# Patient Record
Sex: Female | Born: 1970 | Race: Black or African American | Hispanic: No | Smoking: Never smoker
Health system: Southern US, Community
[De-identification: ages and names within clinical notes are randomized; demographics above are authoritative.]

## PROBLEM LIST (undated history)

## (undated) DIAGNOSIS — F1910 Other psychoactive substance abuse, uncomplicated: Secondary | ICD-10-CM

## (undated) DIAGNOSIS — F319 Bipolar disorder, unspecified: Secondary | ICD-10-CM

## (undated) DIAGNOSIS — F4310 Post-traumatic stress disorder, unspecified: Secondary | ICD-10-CM

## (undated) DIAGNOSIS — F209 Schizophrenia, unspecified: Secondary | ICD-10-CM

## (undated) DIAGNOSIS — B20 Human immunodeficiency virus [HIV] disease: Secondary | ICD-10-CM

---

## 2001-03-20 ENCOUNTER — Inpatient Hospital Stay (HOSPITAL_COMMUNITY): Admission: AD | Admit: 2001-03-20 | Discharge: 2001-03-20 | Payer: Self-pay | Admitting: *Deleted

## 2003-02-06 ENCOUNTER — Emergency Department (HOSPITAL_COMMUNITY): Admission: EM | Admit: 2003-02-06 | Discharge: 2003-02-06 | Payer: Self-pay | Admitting: Emergency Medicine

## 2005-04-26 ENCOUNTER — Emergency Department (HOSPITAL_COMMUNITY): Admission: EM | Admit: 2005-04-26 | Discharge: 2005-04-26 | Payer: Self-pay | Admitting: Emergency Medicine

## 2005-05-16 ENCOUNTER — Emergency Department (HOSPITAL_COMMUNITY): Admission: EM | Admit: 2005-05-16 | Discharge: 2005-05-16 | Payer: Self-pay | Admitting: Emergency Medicine

## 2005-06-16 ENCOUNTER — Emergency Department (HOSPITAL_COMMUNITY): Admission: EM | Admit: 2005-06-16 | Discharge: 2005-06-16 | Payer: Self-pay | Admitting: Diagnostic Radiology

## 2005-08-28 ENCOUNTER — Emergency Department (HOSPITAL_COMMUNITY): Admission: EM | Admit: 2005-08-28 | Discharge: 2005-08-28 | Payer: Self-pay | Admitting: *Deleted

## 2012-01-12 ENCOUNTER — Emergency Department (HOSPITAL_BASED_OUTPATIENT_CLINIC_OR_DEPARTMENT_OTHER)
Admission: EM | Admit: 2012-01-12 | Discharge: 2012-01-12 | Disposition: A | Discharge status: Home / Self Care | Payer: Self-pay | Attending: Emergency Medicine | Admitting: Emergency Medicine

## 2012-01-12 ENCOUNTER — Encounter (HOSPITAL_BASED_OUTPATIENT_CLINIC_OR_DEPARTMENT_OTHER): Payer: Self-pay

## 2012-01-12 DIAGNOSIS — A599 Trichomoniasis, unspecified: Secondary | ICD-10-CM

## 2012-01-12 DIAGNOSIS — F4310 Post-traumatic stress disorder, unspecified: Secondary | ICD-10-CM | POA: Insufficient documentation

## 2012-01-12 DIAGNOSIS — Z79899 Other long term (current) drug therapy: Secondary | ICD-10-CM | POA: Insufficient documentation

## 2012-01-12 DIAGNOSIS — F319 Bipolar disorder, unspecified: Secondary | ICD-10-CM | POA: Insufficient documentation

## 2012-01-12 DIAGNOSIS — Z3202 Encounter for pregnancy test, result negative: Secondary | ICD-10-CM | POA: Insufficient documentation

## 2012-01-12 DIAGNOSIS — N760 Acute vaginitis: Secondary | ICD-10-CM | POA: Insufficient documentation

## 2012-01-12 DIAGNOSIS — A5901 Trichomonal vulvovaginitis: Secondary | ICD-10-CM | POA: Insufficient documentation

## 2012-01-12 DIAGNOSIS — B9689 Other specified bacterial agents as the cause of diseases classified elsewhere: Secondary | ICD-10-CM

## 2012-01-12 DIAGNOSIS — F209 Schizophrenia, unspecified: Secondary | ICD-10-CM | POA: Insufficient documentation

## 2012-01-12 DIAGNOSIS — F1910 Other psychoactive substance abuse, uncomplicated: Secondary | ICD-10-CM | POA: Insufficient documentation

## 2012-01-12 HISTORY — DX: Bipolar disorder, unspecified: F31.9

## 2012-01-12 HISTORY — DX: Post-traumatic stress disorder, unspecified: F43.10

## 2012-01-12 HISTORY — DX: Schizophrenia, unspecified: F20.9

## 2012-01-12 HISTORY — DX: Other psychoactive substance abuse, uncomplicated: F19.10

## 2012-01-12 LAB — URINALYSIS, ROUTINE W REFLEX MICROSCOPIC
Ketones, ur: NEGATIVE mg/dL
Nitrite: NEGATIVE
pH: 5.5 (ref 5.0–8.0)

## 2012-01-12 LAB — URINE MICROSCOPIC-ADD ON

## 2012-01-12 LAB — WET PREP, GENITAL

## 2012-01-12 MED ORDER — METRONIDAZOLE 500 MG PO TABS: 500.0000 mg | ORAL_TABLET | Freq: Two times a day (BID) | ORAL | Status: DC

## 2012-01-12 MED ORDER — NITROFURANTOIN MONOHYD MACRO 100 MG PO CAPS: 100.0000 mg | ORAL_CAPSULE | Freq: Two times a day (BID) | ORAL | Status: AC

## 2012-01-12 NOTE — ED Notes (Signed)
Pt reports vaginal d/c x 3 days.

## 2012-01-12 NOTE — ED Provider Notes (Signed)
History     CSN: [id number]  Arrival date & time 01/12/12  1401   First MD Initiated Contact with Patient 01/12/12 1429      Chief Complaint  Patient presents with  . Vaginal Discharge    (Consider location/radiation/quality/duration/timing/severity/associated sxs/prior treatment) Patient is a 41 y.o. female presenting with vaginal discharge. The history is provided by the patient. No language interpreter was used.  Vaginal Discharge This is a new problem. Episode onset: 3 days. The problem occurs constantly. The problem has been gradually worsening. Pertinent negatives include no abdominal pain. Nothing aggravates the symptoms. She has tried nothing for the symptoms. The treatment provided moderate relief.  Pt complains of a vaginal discharge.   Pt reports she noticed about 3 days ago  Past Medical History  Diagnosis Date  . Schizophrenia   . Bipolar 1 disorder   . PTSD (post-traumatic stress disorder)   . Substance abuse     History reviewed. No pertinent past surgical history.  History reviewed. No pertinent family history.  History  Substance Use Topics  . Smoking status: Never Smoker   . Smokeless tobacco: Not on file  . Alcohol Use: Yes     Comment: weekly    OB History    Grav Para Term Preterm Abortions TAB SAB Ect Mult Living                  Review of Systems  Gastrointestinal: Negative for abdominal pain.  Genitourinary: Positive for vaginal discharge.  All other systems reviewed and are negative.    Allergies  Review of patient's allergies indicates no known allergies.  Home Medications   Current Outpatient Rx  Name  Route  Sig  Dispense  Refill  . PALIPERIDONE ER 9 MG PO TB24   Oral   Take 9 mg by mouth every morning.         . TRAZODONE HCL 100 MG PO TABS   Oral   Take 100 mg by mouth at bedtime.         . VENLAFAXINE HCL ER 150 MG PO CP24   Oral   Take 150 mg by mouth daily.           BP 108/59  Pulse 73  Temp 97.8 F  (36.6 C) (Oral)  Resp 16  Ht 5\' 3"  (1.6 m)  Wt 120 lb (54.432 kg)  BMI 21.26 kg/m2  SpO2 99%  LMP 08/26/2011  Physical Exam  Nursing note and vitals reviewed. Constitutional: She is oriented to person, place, and time. She appears well-developed and well-nourished.  HENT:  Head: Normocephalic and atraumatic.  Right Ear: External ear normal.  Left Ear: External ear normal.  Nose: Nose normal.  Mouth/Throat: Oropharynx is clear and moist.  Eyes: EOM are normal. Pupils are equal, round, and reactive to light.  Neck: Normal range of motion.  Cardiovascular: Normal rate and normal heart sounds.   Pulmonary/Chest: Effort normal and breath sounds normal.  Abdominal: Soft.  Genitourinary: Uterus normal. Vaginal discharge found.       Thick white discharge  Musculoskeletal: Normal range of motion.  Neurological: She is alert and oriented to person, place, and time. She has normal reflexes.  Skin: Skin is warm.  Psychiatric: She has a normal mood and affect.    ED Course  Procedures (including critical care time)  Labs Reviewed  URINALYSIS, ROUTINE W REFLEX MICROSCOPIC - Abnormal; Notable for the following:    Leukocytes, UA SMALL (*)     All  other components within normal limits  URINE MICROSCOPIC-ADD ON - Abnormal; Notable for the following:    Squamous Epithelial / LPF FEW (*)     Bacteria, UA FEW (*)     All other components within normal limits  PREGNANCY, URINE  URINE CULTURE   No results found.   1. BV (bacterial vaginosis)   2. Trichomonas       MDM  Rx for flagyl and macrobid.        Lonia Skinner [place], [place] 01/12/12 1548  Lonia Skinner [place], [place] 01/12/12 1551  Lonia Skinner [place], [place] 01/12/12 1552  Lonia Skinner [place], [place] 01/12/12 [phone]

## 2012-01-13 LAB — URINE CULTURE

## 2012-01-13 NOTE — ED Provider Notes (Signed)
Medical screening examination/treatment/procedure(s) were performed by non-physician practitioner and as supervising physician I was immediately available for consultation/collaboration.   Loren Racer, MD 01/13/12 [phone]

## 2018-05-02 ENCOUNTER — Emergency Department (HOSPITAL_COMMUNITY)
Admission: EM | Admit: 2018-05-02 | Discharge: 2018-05-02 | Disposition: A | Discharge status: Home / Self Care | Payer: Self-pay | Attending: Emergency Medicine | Admitting: Emergency Medicine

## 2018-05-02 ENCOUNTER — Other Ambulatory Visit: Payer: Self-pay

## 2018-05-02 DIAGNOSIS — F209 Schizophrenia, unspecified: Secondary | ICD-10-CM | POA: Insufficient documentation

## 2018-05-02 DIAGNOSIS — F1910 Other psychoactive substance abuse, uncomplicated: Secondary | ICD-10-CM | POA: Insufficient documentation

## 2018-05-02 DIAGNOSIS — Z79899 Other long term (current) drug therapy: Secondary | ICD-10-CM | POA: Insufficient documentation

## 2018-05-02 NOTE — Discharge Instructions (Signed)
Use the resources provided for outpatient assistance with substance abuse.  Please return to the ER if you begin having feelings of harming yourself or ending your life, or thoughts of harming others.

## 2018-05-02 NOTE — ED Provider Notes (Signed)
[name]   CSN: [id number] Arrival date & time: 05/02/18  9470    History   Chief Complaint Chief Complaint  Patient presents with  . Addiction Problem    HPI Monica Warner is a 48 y.o. female with past medical history of bipolar 1 disorder, schizophrenia, substance abuse, presenting to the emergency department requesting assistance with placement at [facility] for rehabilitation.  [facility].  She had lab work done, including CBC and BMP which were unremarkable.  She was discharged with outpatient resources.  She presents to this ED now stating that the walk-in clinic at [facility] was not accepting new patients and she was instructed she needed to go through the hospital and case management in order to be accepted at [facility].  She endorses crack cocaine use and alcohol use.  She states she was clean for 3 years, however relapsed in November.  Last crack cocaine use is around 2 AM this morning, last alcohol use was 7 AM today.  She states she normally drinks about three 40 ounce bottles of beer daily.  She has never had withdrawal seizures.  She endorses feelings of anxiety and worry, stating she needs to get better.  She has not taken her psychiatric medication in 2 weeks.  She endorses auditory and visual hallucinations, however that has not been her concern recently.  It is not worsening from her baseline.  She denies suicidal or homicidal ideations.  Denies feelings of paranoia.  No medical complaints today.     The history is provided by the patient and medical records.    Past Medical History:  Diagnosis Date  . Bipolar 1 disorder   . PTSD (post-traumatic stress disorder)   . Schizophrenia   . Substance abuse     There are no active problems to display for this patient.   No past surgical history on file.   OB History   No obstetric history on file.      Home  Medications    Prior to Admission medications   Medication Sig Start Date End Date Taking? Authorizing Provider  atorvastatin (LIPITOR) 20 MG tablet Take 20 mg by mouth every evening.  04/04/18  Yes [provider]  BIKTARVY 50-200-25 MG TABS tablet Take 1 tablet by mouth daily. 04/04/18  Yes [provider]  valACYclovir (VALTREX) 1000 MG tablet Take 1,000 mg by mouth daily. 04/05/18  Yes [provider]  metroNIDAZOLE (FLAGYL) 500 MG tablet Take 1 tablet (500 mg total) by mouth 2 (two) times daily. Patient not taking: Reported on 05/02/2018 01/12/12   Osie Cheeks    Family History No family history on file.  Social History Social History   Tobacco Use  . Smoking status: Never Smoker  Substance Use Topics  . Alcohol use: Yes    Comment: weekly  . Drug use: Yes    Types: Cocaine    Comment: last used Saturday- is presently in [facility]     Allergies   Penicillins   Review of Systems Review of Systems  Neurological: Negative for seizures.  Psychiatric/Behavioral: Positive for hallucinations. Negative for self-injury and suicidal ideas. The patient is nervous/anxious.   All other systems reviewed and are negative.    Physical Exam Updated Vital Signs BP 118/78   Pulse 90   Temp 98 F (36.7 C) (Oral)   Resp 20   Ht 5\' 3"  (1.6 m)  Wt 77.1 kg   SpO2 99%   BMI 30.11 kg/m   Physical Exam Vitals signs and nursing note reviewed.  Constitutional:      Appearance: She is well-developed.  HENT:     Head: Normocephalic and atraumatic.  Eyes:     Conjunctiva/sclera: Conjunctivae normal.  Pulmonary:     Effort: Pulmonary effort is normal.  Abdominal:     Palpations: Abdomen is soft.  Skin:    General: Skin is warm.  Neurological:     Mental Status: She is alert.  Psychiatric:        Attention and Perception: Attention normal.        Mood and Affect: Mood is anxious.        Speech: Speech is rapid and pressured.        Behavior:  Behavior is cooperative.        Thought Content: Thought content is not paranoid. Thought content does not include homicidal or suicidal ideation.     Comments: Pt appears anxious, however is able to be verbally de-escalated.  Patient does not appear to be responding to internal stimuli.      ED Treatments / Results  Labs (all labs ordered are listed, but only abnormal results are displayed) Labs Reviewed - No data to display  EKG None  Radiology No results found.  Procedures Procedures (including critical care time)  Medications Ordered in ED Medications - No data to display   Initial Impression / Assessment and Plan / ED Course  I have reviewed the triage vital signs and the nursing notes.  Pertinent labs & imaging results that were available during my care of the patient were reviewed by me and considered in my medical decision making (see chart for details).        Patient presenting requesting assistance with rehab placement for alcohol and crack cocaine use.  Patient reports presenting to the walk-in clinic at [facility], however was declined in the setting of the current COVID-19 pandemic.  [facility] just [facility] to arrival to this ED with same request.  During that visit, she had labs obtained which were unremarkable.  She has history of schizophrenia and endorses auditory and visual hallucinations today, however not worsening from her baseline.  She denies suicidal or homicidal ideations.  She has not expressed feelings of paranoia or delusions.  Patient appears anxious on exam, however does not appear to be a threat to herself or others.  Last alcohol use was 7 AM, last crack cocaine use was 2 AM this morning.  Unlikely in withdrawal.  Patient provided with outpatient resources by peer support and social work.  In the setting of the COVID-19 pandemic, local rehabilitation clinics for substance abuse or not accepting new inpatients at this time,  however outpatient resources are still available.  Patient is aware of this, and discharged with the appropriate resources.  Patient was discussed with Dr. Anitra LauthPlunkett.  Discussed results, findings, treatment and follow up. Patient advised of return precautions. Patient verbalized understanding and agreed with plan.   Final Clinical Impressions(s) / ED Diagnoses   Final diagnoses:  Polysubstance abuse [facility](HCC)    ED Discharge Orders    None       Robinson, [place] N, PA-C 05/02/18 1437    Gwyneth SproutPlunkett, [place], MD 05/03/18 1950

## 2018-05-02 NOTE — ED Triage Notes (Signed)
Pt looking for assistance with rehab placement.  Wants to stop smoking cracking, drinking ETOH.  Pt reports last drug use appx 0200 today and last drink 0700 today.  Pt cooperative, alert.

## 2018-05-02 NOTE — Patient Outreach (Signed)
ED Peer Support Specialist Patient Intake (Complete at intake & 30-60 Day Follow-up)  Name: Monica Warner  MRN: [id number]  Age: 48 y.o.   Date of Admission: 05/02/2018  Intake: Initial Comments:      Primary Reason Admitted: alcohol and crack cocaine use  Lab values: Alcohol/ETOH: Not completed Positive UDS? 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 No  Number of ED visits:    Reason(s) for visit:    In past year, has patient been hospitalized for any reason? No  Number of hospitalizations:    Reason(s) for hospitalization:    In past year, has patient been arrested? No  Number of arrests:    Reason(s) for arrest:    In past year, has patient been incarcerated? No  Number of incarcerations:    Reason(s) for incarceration:    In past year, has patient received medication-assisted treatment? No  In past year, patient received the following treatments:    In past year, has patient received any harm reduction services? No  Did this include any of the following?    In past year, has patient received care from a mental health provider for diagnosis other than SUD? No  In past year, is this first  time patient has overdosed? (has not overdosed)  Number of past overdoses:    In past year, is this first time patient has been hospitalized for an overdose? (has not overdosed)  Number of hospitalizations for overdose(s):    Is patient currently receiving treatment for a mental health diagnosis? No  Patient reports experiencing difficulty participating in SUD treatment: No    Most important reason(s) for this difficulty?    Has patient received prior services for treatment? No  In past, patient has received services from following agencies:    Plan of Care:  Suggested follow up at these agencies/treatment centers: [facility] ([facility])([facility] was interested in detox/long-term substance use treatment resources.  [facility]PSS will help with those resources. )  Other information: [facility] met with the patient to provide substance use recovery support and help with substance use treatment resources. Currently, [facility] and [facility] do not have female detox/residential substance use treatment beds available. Patient was interested in following up with [facility] in [place] where they currently have female detox beds, but have a wait list for the residential treatment beds. CPSS provided follow-up information [place] program as well as a detox center list, residential/outpatient substance use treatment center list, [facility], and homeless shelter list with information for the women's emergency shelter at the [place].    Mason Jim, CPSS  05/02/2018 1:29 PM

## 2018-05-02 NOTE — ED Notes (Signed)
Peer Support at Bedside

## 2018-05-02 NOTE — ED Notes (Signed)
Reviewed D/C paperwork with pt. Pt verbalizes understanding. Pt was ambulatory upon discharge.

## 2019-04-21 ENCOUNTER — Encounter (HOSPITAL_BASED_OUTPATIENT_CLINIC_OR_DEPARTMENT_OTHER): Payer: Self-pay | Admitting: Emergency Medicine

## 2019-04-21 ENCOUNTER — Other Ambulatory Visit: Payer: Self-pay

## 2019-04-21 ENCOUNTER — Emergency Department (HOSPITAL_BASED_OUTPATIENT_CLINIC_OR_DEPARTMENT_OTHER)
Admission: EM | Admit: 2019-04-21 | Discharge: 2019-04-21 | Disposition: A | Discharge status: Home / Self Care | Payer: BC Managed Care – PPO | Attending: Emergency Medicine | Admitting: Emergency Medicine

## 2019-04-21 DIAGNOSIS — A599 Trichomoniasis, unspecified: Secondary | ICD-10-CM

## 2019-04-21 DIAGNOSIS — A5901 Trichomonal vulvovaginitis: Secondary | ICD-10-CM | POA: Insufficient documentation

## 2019-04-21 DIAGNOSIS — N760 Acute vaginitis: Secondary | ICD-10-CM | POA: Insufficient documentation

## 2019-04-21 DIAGNOSIS — B20 Human immunodeficiency virus [HIV] disease: Secondary | ICD-10-CM | POA: Diagnosis not present

## 2019-04-21 DIAGNOSIS — Z79899 Other long term (current) drug therapy: Secondary | ICD-10-CM | POA: Diagnosis not present

## 2019-04-21 DIAGNOSIS — B9689 Other specified bacterial agents as the cause of diseases classified elsewhere: Secondary | ICD-10-CM

## 2019-04-21 DIAGNOSIS — Z880 Allergy status to penicillin: Secondary | ICD-10-CM | POA: Diagnosis not present

## 2019-04-21 DIAGNOSIS — N899 Noninflammatory disorder of vagina, unspecified: Secondary | ICD-10-CM | POA: Diagnosis present

## 2019-04-21 HISTORY — DX: Human immunodeficiency virus (HIV) disease: B20

## 2019-04-21 LAB — URINALYSIS, ROUTINE W REFLEX MICROSCOPIC
Bilirubin Urine: NEGATIVE
Glucose, UA: NEGATIVE mg/dL
Ketones, ur: NEGATIVE mg/dL
Nitrite: NEGATIVE
Protein, ur: NEGATIVE mg/dL
Specific Gravity, Urine: 1.02 (ref 1.005–1.030)
pH: 6 (ref 5.0–8.0)

## 2019-04-21 LAB — URINALYSIS, MICROSCOPIC (REFLEX)

## 2019-04-21 LAB — WET PREP, GENITAL
Trich, Wet Prep: NONE SEEN
Yeast Wet Prep HPF POC: NONE SEEN

## 2019-04-21 LAB — PREGNANCY, URINE: Preg Test, Ur: NEGATIVE

## 2019-04-21 MED ORDER — LIDOCAINE HCL (PF) 1 % IJ SOLN
INTRAMUSCULAR | Status: AC
  Administered 2019-04-21: 1 mL
  Filled 2019-04-21: qty 5

## 2019-04-21 MED ORDER — CEFTRIAXONE SODIUM 500 MG IJ SOLR
500.0000 mg | Freq: Once | INTRAMUSCULAR | Status: AC
  Administered 2019-04-21: 500 mg via INTRAMUSCULAR
  Filled 2019-04-21: qty 500

## 2019-04-21 MED ORDER — METRONIDAZOLE 500 MG PO TABS: 500.0000 mg | ORAL_TABLET | Freq: Two times a day (BID) | ORAL | 0 refills | Status: AC

## 2019-04-21 MED ORDER — DOXYCYCLINE HYCLATE 100 MG PO CAPS: 100.0000 mg | ORAL_CAPSULE | Freq: Two times a day (BID) | ORAL | 0 refills | Status: AC

## 2019-04-21 NOTE — ED Provider Notes (Signed)
[facility] EMERGENCY DEPARTMENT Provider Note   CSN: [id number] Arrival date & time: 04/21/19  0820     History Chief Complaint  Patient presents with  . Vaginal Discharge  . Vaginal Itching    Monica Warner is a 49 y.o. female.  49yo F w/ PMH including bipolar d/o, HIV who p/w vaginal discharge. Pt notes that at the beginning of the month, she had a menstrual period which she hasn't had in many years. After that, she began having vaginal discharge and itching that has worsened despite trying OTC yeast infection medication. Recently she has been having some lower abd discomfort/soreness. No fevers, vomiting, or recent illness. Mild dysuria this morning. She notes that she recently got back together with her ex-husband and she is concerned he may have given her an STD.   The history is provided by the patient.  Vaginal Discharge Associated symptoms: vaginal itching   Vaginal Itching       Past Medical History:  Diagnosis Date  . Bipolar 1 disorder ([place])   . HIV (human immunodeficiency virus infection) ([place])   . PTSD (post-traumatic stress disorder)   . Schizophrenia ([place])   . Substance abuse ([place])     There are no problems to display for this patient.   History reviewed. No pertinent surgical history.   OB History   No obstetric history on file.     History reviewed. No pertinent family history.  Social History   Tobacco Use  . Smoking status: Never Smoker  Substance Use Topics  . Alcohol use: Yes    Comment: weekly  . Drug use: Yes    Types: Cocaine    Comment: last used Saturday- is presently in [facility] Medications Prior to Admission medications   Medication Sig Start Date End Date Taking? Authorizing Provider  atorvastatin (LIPITOR) 20 MG tablet Take 20 mg by mouth every evening.  04/04/18   [provider]  BIKTARVY 50-200-25 MG TABS tablet Take 1 tablet by mouth daily. 04/04/18   [provider]  doxycycline  (VIBRAMYCIN) 100 MG capsule Take 1 capsule (100 mg total) by mouth 2 (two) times daily. 04/21/19   Monica Warner, Monica Overland, MD  metroNIDAZOLE (FLAGYL) 500 MG tablet Take 1 tablet (500 mg total) by mouth 2 (two) times daily. 04/21/19   Monica Warner, Monica Overland, MD  valACYclovir (VALTREX) 1000 MG tablet Take 1,000 mg by mouth daily. 04/05/18   [provider]    Allergies    Penicillins  Review of Systems   Review of Systems  Genitourinary: Positive for vaginal discharge.   All other systems reviewed and are negative except that which was mentioned in HPI  Physical Exam Updated Vital Signs BP 135/89   Pulse 63   Temp 98.3 F (36.8 C)   Resp 18   SpO2 100%   Physical Exam Vitals and nursing note reviewed. Exam conducted with a chaperone present.  Constitutional:      General: She is not in acute distress.    Appearance: She is well-developed.  HENT:     Head: Normocephalic and atraumatic.     Nose: Nose normal.  Eyes:     Conjunctiva/sclera: Conjunctivae normal.  Abdominal:     General: Abdomen is flat.     Palpations: Abdomen is soft.     Tenderness: There is abdominal tenderness (suprapubic).  Genitourinary:    Comments: Trace white discharge in vaginal vault, no cervical lesions, +CMT and uterine tenderness on bimanual exam  Musculoskeletal:     Cervical back: Neck supple.  Skin:    General: Skin is warm and dry.  Neurological:     Mental Status: She is alert and oriented to person, place, and time.  Psychiatric:        Judgment: Judgment normal.     ED Results / Procedures / Treatments   Labs (all labs ordered are listed, but only abnormal results are displayed) Labs Reviewed  WET PREP, GENITAL - Abnormal; Notable for the following components:      Result Value   Clue Cells Wet Prep HPF POC PRESENT (*)    WBC, Wet Prep HPF POC MANY (*)    All other components within normal limits  URINALYSIS, ROUTINE W REFLEX MICROSCOPIC - Abnormal; Notable for the  following components:   Hgb urine dipstick TRACE (*)    Leukocytes,Ua SMALL (*)    All other components within normal limits  URINALYSIS, MICROSCOPIC (REFLEX) - Abnormal; Notable for the following components:   Bacteria, UA FEW (*)    Trichomonas, UA PRESENT (*)    All other components within normal limits  URINE CULTURE  PREGNANCY, URINE  RPR  GC/CHLAMYDIA PROBE AMP ([place]) NOT AT [facility]    EKG None  Radiology No results found.  Procedures Procedures (including critical care time)  Medications Ordered in ED Medications  cefTRIAXone (ROCEPHIN) injection 500 mg (500 mg Intramuscular Given 04/21/19 0919)  lidocaine (PF) (XYLOCAINE) 1 % injection (1 mL  Given 04/21/19 0919)    ED Course  I have reviewed the triage vital signs and the nursing notes.  Pertinent labs that were available during my care of the patient were reviewed by me and considered in my medical decision making (see chart for details).    MDM Rules/Calculators/A&P                      VS reassuring. No focal unilateral pain to suggest TOA. UA w/ trichomonas, wet prep w/ clue cells. Based on exam, will treat for early PID with CTX and doxycycline as well as flagyl for BV. Counseled on safe sex practices and having partners tested and treated prior to future intercourse. Counseled on side effects of antibiotics. Return precautions reviewed. Final Clinical Impression(s) / ED Diagnoses Final diagnoses:  BV (bacterial vaginosis)  Trichomoniasis    Rx / DC Orders ED Discharge Orders         Ordered    doxycycline (VIBRAMYCIN) 100 MG capsule  2 times daily     04/21/19 1000    metroNIDAZOLE (FLAGYL) 500 MG tablet  2 times daily     04/21/19 1000           Monica Warner, Monica Finland, MD 04/21/19 1002

## 2019-04-21 NOTE — ED Triage Notes (Signed)
Pt believes she was exposed to STD by her husband since it has occurred before. Foul smelling vaginal discharge, itching and swelling present.

## 2019-04-22 LAB — URINE CULTURE: Culture: NO GROWTH

## 2019-04-22 LAB — RPR: RPR Ser Ql: NONREACTIVE

## 2019-04-24 LAB — GC/CHLAMYDIA PROBE AMP (~~LOC~~) NOT AT ARMC
Chlamydia: NEGATIVE
Neisseria Gonorrhea: NEGATIVE

## 2019-08-25 ENCOUNTER — Emergency Department (HOSPITAL_BASED_OUTPATIENT_CLINIC_OR_DEPARTMENT_OTHER)
Admission: EM | Admit: 2019-08-25 | Discharge: 2019-08-25 | Disposition: A | Discharge status: Home / Self Care | Payer: BC Managed Care – PPO | Attending: Emergency Medicine | Admitting: Emergency Medicine

## 2019-08-25 ENCOUNTER — Encounter (HOSPITAL_BASED_OUTPATIENT_CLINIC_OR_DEPARTMENT_OTHER): Payer: Self-pay | Admitting: Emergency Medicine

## 2019-08-25 ENCOUNTER — Other Ambulatory Visit: Payer: Self-pay

## 2019-08-25 DIAGNOSIS — M25562 Pain in left knee: Secondary | ICD-10-CM | POA: Diagnosis not present

## 2019-08-25 DIAGNOSIS — F1420 Cocaine dependence, uncomplicated: Secondary | ICD-10-CM | POA: Insufficient documentation

## 2019-08-25 DIAGNOSIS — K0889 Other specified disorders of teeth and supporting structures: Secondary | ICD-10-CM

## 2019-08-25 DIAGNOSIS — G8929 Other chronic pain: Secondary | ICD-10-CM

## 2019-08-25 DIAGNOSIS — H9202 Otalgia, left ear: Secondary | ICD-10-CM

## 2019-08-25 DIAGNOSIS — R140 Abdominal distension (gaseous): Secondary | ICD-10-CM | POA: Diagnosis not present

## 2019-08-25 DIAGNOSIS — M25561 Pain in right knee: Secondary | ICD-10-CM | POA: Insufficient documentation

## 2019-08-25 MED ORDER — ACETAMINOPHEN 500 MG PO TABS: 1000.0000 mg | ORAL_TABLET | Freq: Four times a day (QID) | ORAL | 0 refills | Status: AC | PRN

## 2019-08-25 MED ORDER — OMEPRAZOLE 20 MG PO CPDR: 20.0000 mg | DELAYED_RELEASE_CAPSULE | Freq: Every day | ORAL | 0 refills | Status: AC

## 2019-08-25 MED ORDER — CHLORHEXIDINE GLUCONATE 0.12 % MT SOLN: 15.0000 mL | Freq: Two times a day (BID) | OROMUCOSAL | 0 refills | Status: AC

## 2019-08-25 MED ORDER — AMOXICILLIN 500 MG PO CAPS: 1000.0000 mg | ORAL_CAPSULE | Freq: Two times a day (BID) | ORAL | 0 refills | Status: AC

## 2019-08-25 NOTE — Discharge Instructions (Signed)
1.  Start taking antibiotic as prescribed for your dental and ear pain.  Make an appointment with your dentist for recheck as soon as possible.  Swish with the Peridex solution twice daily. 2.  Start taking omeprazole daily.  Take it 30 minutes before you eat breakfast in the morning.  Eat small, low-fat meals regularly. 3.  You most likely have arthritis in your knees.  You may use extra strength Tylenol every 6 hours as needed for pain.  Wear a knee sleeve when standing and working.  Elevate and ice after working or prolonged standing. 4.  You must have a family doctor to help you manage longstanding medical problems.  You may need referral to a orthopedic doctor for chronic knee pain and to a gastroenterologist for intermittent constipation variable with diarrhea and bloating.  Use the referral number in your discharge instructions to find a doctor.  Your infectious disease doctor can also most likely help you find a family physician provider.

## 2019-08-25 NOTE — ED Notes (Signed)
Pt. Allowed salt cracker, G crackers and cheese approved by Earlie Counts

## 2019-08-25 NOTE — ED Triage Notes (Signed)
bilat knee pain x 3 weeks since starting a new job. Also endorses L dental pain and ear pain.

## 2019-08-25 NOTE — ED Provider Notes (Signed)
[facility] EMERGENCY DEPARTMENT Provider Note   CSN: [id number] Arrival date & time: 08/25/19  0932     History Chief Complaint  Patient presents with  . Knee Pain  . Dental Pain    Monica Warner is a 49 y.o. female.  HPI Patient has 3 separate complaints.  First complaint is dental pain.  She reports she is having pain in the upper left teeth.  It hurts up into her cheek and into her left ear.  She reports sometimes her swelling.  She reports she took antibiotics and a swish solution over a month ago and her symptoms improved.  No fevers or chills.  No neck stiffness.  Patient also complains of variable abdominal bloating, constipation, diarrhea.  She reports the symptoms come and go.  She may be constipated and then have diarrhea.  Often she feels like her abdomen is bloating.  She does not have vomiting or fever.  Patient reports she does eat irregular meals.  She reports she used to have a medication she took 3 times a day that was helpful but she does not remember what it was.  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  Patient also complains of knee pain.  Both knees hurt.  Left greater than right.  She reports she had a fracture in the left knee very distant history.  She reports ever since that time it is hurt on the inner joint line.  She stands and walks at work a lot.  She reports after work it is more swollen and painful.  She has been wearing a knee sleeve and icing which has been helpful.  She denies having seen an orthopedic doctor since she has been having these problems.  This is been persistent for months now.  No redness.  No fever no chills.  No pain or swelling in the calves or feet.  Patient has HIV.  She is compliant with her medications.  Last viral testing showed disease to be well managed.    Past Medical History:    Diagnosis Date  . Bipolar 1 disorder (HCC)   . HIV (human immunodeficiency virus infection) (HCC)   . PTSD (post-traumatic stress disorder)   . Schizophrenia (HCC)   . Substance abuse (HCC)     There are no problems to display for this patient.   History reviewed. No pertinent surgical history.   OB History   No obstetric history on file.     No family history on file.  Social History   Tobacco Use  . Smoking status: Never Smoker  . Smokeless tobacco: Never Used  Substance Use Topics  . Alcohol use: Yes    Comment: weekly  . Drug use: Yes    Types: Cocaine    Comment: last used Saturday- is presently in [facility] Medications Prior to Admission medications   Medication Sig Start Date End Date Taking? Authorizing Provider  acetaminophen (TYLENOL) 500 MG tablet Take 2 tablets (1,000 mg total) by mouth every 6 (six) hours as needed. 08/25/19   Arby Barrette, MD  amoxicillin (AMOXIL) 500 MG capsule Take 2 capsules (1,000 mg total) by mouth 2 (two) times daily. 08/25/19   Arby Barrette, MD  atorvastatin (LIPITOR) 20 MG tablet Take 20 mg by mouth every evening.  04/04/18   [provider]  BIKTARVY 50-200-25 MG TABS tablet  Take 1 tablet by mouth daily. 04/04/18   [provider]  chlorhexidine (PERIDEX) 0.12 % solution Use as directed 15 mLs in the mouth or throat 2 (two) times daily. 08/25/19   Arby Barrette, MD  doxycycline (VIBRAMYCIN) 100 MG capsule Take 1 capsule (100 mg total) by mouth 2 (two) times daily. 04/21/19   Little, Ambrose Finland, MD  metroNIDAZOLE (FLAGYL) 500 MG tablet Take 1 tablet (500 mg total) by mouth 2 (two) times daily. 04/21/19   Little, Ambrose Finland, MD  omeprazole (PRILOSEC) 20 MG capsule Take 1 capsule (20 mg total) by mouth daily. 08/25/19   Arby Barrette, MD  valACYclovir (VALTREX) 1000 MG tablet Take 1,000 mg by mouth daily. 04/05/18   [provider]    Allergies    Penicillins  Review of Systems   Review  of Systems 10 systems reviewed and negative except as per HPI Physical Exam Updated Vital Signs BP (!) 141/87 (BP Location: Right Arm)   Pulse 71   Temp 98.6 F (37 C) (Oral)   Resp 18   Ht 5\' 3"  (1.6 m)   Wt 71.2 kg   SpO2 99%   BMI 27.81 kg/m   Physical Exam Constitutional:      Appearance: She is well-developed.  HENT:     Head: Normocephalic and atraumatic.     Nose: Nose normal.     Mouth/Throat:     Mouth: Mucous membranes are moist.     Pharynx: Oropharynx is clear.     Comments: Dentition is in visibly good condition.  She has had dental work with cavities filled.  No facial swelling at this time.  Modest amount of plaque on the posterior upper left molars with very mild gingivitis.  Posterior airway patent.  Left TM is normal.  No swelling of the ear canal or pinna.  No rashes of the face. Eyes:     Pupils: Pupils are equal, round, and reactive to light.  Cardiovascular:     Rate and Rhythm: Normal rate and regular rhythm.     Heart sounds: Normal heart sounds.  Pulmonary:     Effort: Pulmonary effort is normal.     Breath sounds: Normal breath sounds.  Abdominal:     General: Bowel sounds are normal. There is no distension.     Palpations: Abdomen is soft.     Tenderness: There is no abdominal tenderness.  Musculoskeletal:        General: Normal range of motion.     Cervical back: Neck supple.     Comments: Bilateral knees do not show any effusion present.  No warmth.  Normal in appearance.  Left knee has significant amount of crepitus with range of motion.  Patient endorses discomfort to palpation along the medial joint line on the left.  Calves are soft and nontender.  Feet normal without swelling.  Warm and dry.  Skin:    General: Skin is warm and dry.  Neurological:     Mental Status: She is alert and oriented to person, place, and time.     GCS: GCS eye subscore is 4. GCS verbal subscore is 5. GCS motor subscore is 6.     Coordination: Coordination normal.   Psychiatric:        Mood and Affect: Mood normal.     ED Results / Procedures / Treatments   Labs (all labs ordered are listed, but only abnormal results are displayed) Labs Reviewed - No data to display  EKG None  Radiology No results found.  Procedures Procedures (including critical care time)  Medications Ordered in ED Medications - No data to display  ED Course  I have reviewed the triage vital signs and the nursing notes.  Pertinent labs & imaging results that were available during my care of the patient were reviewed by me and considered in my medical decision making (see chart for details).    MDM Rules/Calculators/A&P                         Dental pain and ear pain.  At this time exam is normal without facial swelling.  Patient however has pain suggestive of possible apical abscess.  Will place on amoxicillin and give Peridex solution.  Stable for continued outpatient management.  GI symptoms of intermittent abdominal bloating with episodes of constipation and diarrhea.  Abdomen is completely soft.  Is nontender.  I mentioned irritable bowel syndrome and patient reports she has been told she has this before.  She is not taking any medications currently.  We will have her take omeprazole and recommended dietary changes to small frequent healthy meals.  Patient counseled to get a PCP to manage the symptoms and possible GI referral if needed.  Knee pain.  This is chronic in nature.  No swelling or redness to suggest effusions or septic joint.  Patient has been getting some relief with knee sleeve and icing.  Patient counseled on continuing these treatment modalities and taking acetaminophen for pain control.  She is counseled on necessity to talk to her doctor about referral to orthopedics if needed for what appears to be chronic osteoarthritic type knee pain. Final Clinical Impression(s) / ED Diagnoses Final diagnoses:  Chronic pain of both knees  Pain, dental  Otalgia  of left ear  Abdominal bloating    Rx / DC Orders ED Discharge Orders         Ordered    amoxicillin (AMOXIL) 500 MG capsule  2 times daily     Discontinue  Reprint     08/25/19 0908    chlorhexidine (PERIDEX) 0.12 % solution  2 times daily     Discontinue  Reprint     08/25/19 0908    acetaminophen (TYLENOL) 500 MG tablet  Every 6 hours PRN     Discontinue  Reprint     08/25/19 0908    omeprazole (PRILOSEC) 20 MG capsule  Daily     Discontinue  Reprint     08/25/19 0909           Arby Barrette, MD 08/25/19 [phone]

## 2020-08-15 ENCOUNTER — Emergency Department (HOSPITAL_BASED_OUTPATIENT_CLINIC_OR_DEPARTMENT_OTHER): Payer: BC Managed Care – PPO

## 2020-08-15 ENCOUNTER — Other Ambulatory Visit: Payer: Self-pay

## 2020-08-15 ENCOUNTER — Emergency Department (HOSPITAL_BASED_OUTPATIENT_CLINIC_OR_DEPARTMENT_OTHER)
Admission: EM | Admit: 2020-08-15 | Discharge: 2020-08-15 | Disposition: A | Discharge status: Home / Self Care | Payer: BC Managed Care – PPO | Attending: Emergency Medicine | Admitting: Emergency Medicine

## 2020-08-15 ENCOUNTER — Encounter (HOSPITAL_BASED_OUTPATIENT_CLINIC_OR_DEPARTMENT_OTHER): Payer: Self-pay | Admitting: Emergency Medicine

## 2020-08-15 DIAGNOSIS — Y92410 Unspecified street and highway as the place of occurrence of the external cause: Secondary | ICD-10-CM | POA: Insufficient documentation

## 2020-08-15 DIAGNOSIS — R519 Headache, unspecified: Secondary | ICD-10-CM | POA: Insufficient documentation

## 2020-08-15 DIAGNOSIS — Z21 Asymptomatic human immunodeficiency virus [HIV] infection status: Secondary | ICD-10-CM | POA: Diagnosis not present

## 2020-08-15 DIAGNOSIS — M542 Cervicalgia: Secondary | ICD-10-CM | POA: Insufficient documentation

## 2020-08-15 DIAGNOSIS — M545 Low back pain, unspecified: Secondary | ICD-10-CM | POA: Insufficient documentation

## 2020-08-15 DIAGNOSIS — M7918 Myalgia, other site: Secondary | ICD-10-CM

## 2020-08-15 MED ORDER — IBUPROFEN 400 MG PO TABS
600.0000 mg | ORAL_TABLET | Freq: Once | ORAL | Status: AC
  Administered 2020-08-15: 600 mg via ORAL
  Filled 2020-08-15: qty 1

## 2020-08-15 MED ORDER — METHOCARBAMOL 500 MG PO TABS: 500.0000 mg | ORAL_TABLET | Freq: Two times a day (BID) | ORAL | 0 refills | Status: AC

## 2020-08-15 MED ORDER — METHOCARBAMOL 500 MG PO TABS
500.0000 mg | ORAL_TABLET | Freq: Once | ORAL | Status: AC
  Administered 2020-08-15: 500 mg via ORAL
  Filled 2020-08-15: qty 1

## 2020-08-15 MED ORDER — IBUPROFEN 400 MG PO TABS: 600.0000 mg | ORAL_TABLET | Freq: Once | ORAL | Status: DC

## 2020-08-15 NOTE — Discharge Instructions (Addendum)
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
